# Patient Record
Sex: Male | Born: 2000 | Race: Black or African American | Hispanic: No | Marital: Single | Smoking: Never smoker
Health system: Southern US, Community
[De-identification: ages and names within clinical notes are randomized; demographics above are authoritative.]

## PROBLEM LIST (undated history)

## (undated) DIAGNOSIS — R011 Cardiac murmur, unspecified: Secondary | ICD-10-CM

## (undated) HISTORY — PX: CARDIAC CATHETERIZATION: SHX172

## (undated) HISTORY — PX: CIRCUMCISION: SHX1350

---
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
## 2019-04-19 ENCOUNTER — Encounter: Payer: Self-pay | Admitting: Emergency Medicine

## 2019-04-19 ENCOUNTER — Other Ambulatory Visit: Payer: Self-pay

## 2019-04-19 ENCOUNTER — Emergency Department
Admission: EM | Admit: 2019-04-19 | Discharge: 2019-04-19 | Disposition: A | Discharge status: Home / Self Care | Payer: Medicaid Other | Attending: Student | Admitting: Student

## 2019-04-19 DIAGNOSIS — M436 Torticollis: Secondary | ICD-10-CM | POA: Diagnosis not present

## 2019-04-19 MED ORDER — IBUPROFEN 600 MG PO TABS: 600.0000 mg | ORAL_TABLET | Freq: Three times a day (TID) | ORAL | 0 refills | Status: AC | PRN

## 2019-04-19 MED ORDER — CYCLOBENZAPRINE HCL 10 MG PO TABS: 10.0000 mg | ORAL_TABLET | Freq: Three times a day (TID) | ORAL | 0 refills | Status: AC | PRN

## 2019-04-19 NOTE — ED Triage Notes (Signed)
Woke up this morning with right neck feeling stiff.  Pain with movement. NAD. Ambulatory to check in. Felt fine when went to bed.

## 2019-04-19 NOTE — ED Notes (Signed)
See triage note  Presents with stiffness to right side of neck  States woke up this way  Denies any injury

## 2019-04-19 NOTE — ED Provider Notes (Signed)
Leonard Cooper Note   ____________________________________________   First MD Initiated Contact with Patient 04/19/19 [phone]     (approximate)  I have reviewed the triage vital signs and the nursing notes.   HISTORY  Chief Complaint Torticollis    HPI Leonard Cooper is a 18 y.o. male patient awake this morning with right lateral neck pain.  Patient states decreased range of motion with left lateral movements.  Patient denies radicular component to neck pain.  Patient rates the pain as a 10/10.  Patient described the pain as "achy/tightness".  No palliative measure for complaint.         Past Medical History:  Diagnosis Date  . Heart murmur     Patient Active Problem List   Diagnosis Date Noted  . Migraine without aura and without status migrainosus, not intractable 08/09/2016  . History of concussion 08/09/2016    Past Surgical History:  Procedure Laterality Date  . CARDIAC CATHETERIZATION     as an infant for a heart murmur  . CIRCUMCISION      Prior to Admission medications   Medication Sig Start Date End Date Taking? Authorizing Cooper  cyclobenzaprine (FLEXERIL) 10 MG tablet Take 1 tablet (10 mg total) by mouth 3 (three) times daily as needed. 04/19/19   Joni Reining, PA-C  ibuprofen (ADVIL) 600 MG tablet Take 1 tablet (600 mg total) by mouth every 8 (eight) hours as needed. 04/19/19   Joni Reining, PA-C    Allergies Penicillins  History reviewed. No pertinent family history.  Social History Social History   Tobacco Use  . Smoking status: Never Smoker  . Smokeless tobacco: Never Used  Substance Use Topics  . Alcohol use: No  . Drug use: Not on file    Review of Systems Constitutional: No fever/chills Eyes: No visual changes. ENT: No sore throat. Cardiovascular: Denies chest pain. Respiratory: Denies shortness of breath. Gastrointestinal: No abdominal pain.  No nausea, no vomiting.   No diarrhea.  No constipation. Genitourinary: Negative for dysuria. Musculoskeletal: Right lateral neck pain. Skin: Negative for rash. Neurological: Negative for headaches, focal weakness or numbness. Allergic/Immunilogical: Penicillin ____________________________________________   PHYSICAL EXAM:  VITAL SIGNS: ED Triage Vitals  Enc Vitals Group     BP 04/19/19 0758 135/71     Pulse Rate 04/19/19 0758 (!) 102     Resp 04/19/19 0758 16     Temp 04/19/19 0758 98.2 F (36.8 C)     Temp Source 04/19/19 0758 Oral     SpO2 04/19/19 0758 100 %     Weight 04/19/19 0756 170 lb (77.1 kg)     Height 04/19/19 0756 5\' 8"  (1.727 m)     Head Circumference --      Peak Flow --      Pain Score 04/19/19 0756 10     Pain Loc --      Pain Edu? --      Excl. in GC? --    Constitutional: Alert and oriented. Well appearing and in no acute distress. Neck: No stridor.  No cervical spine tenderness to palpation.  Decreased range of motion with left lateral movements Cardiovascular: Normal rate, regular rhythm. Grossly normal heart sounds.  Good peripheral circulation. Respiratory: Normal respiratory effort.  No retractions. Lungs CTAB. Neurologic:  Normal speech and language. No gross focal neurologic deficits are appreciated. No gait instability. Skin:  Skin is warm, dry and intact. No rash noted. Psychiatric: Mood and affect are  normal. Speech and behavior are normal.  ____________________________________________   LABS (all labs ordered are listed, but only abnormal results are displayed)  Labs Reviewed - No data to display ____________________________________________  EKG   ____________________________________________  RADIOLOGY  ED MD interpretation:    Official radiology report(s): No results found.  ____________________________________________   PROCEDURES  Procedure(s) performed (including Critical Care):  Procedures   ____________________________________________    INITIAL IMPRESSION / ASSESSMENT AND PLAN / ED COURSE  As part of my medical decision making, I reviewed the following data within the [place]      Patient had frequent neck pain this morning.  No radicular component to complaint.  Physical exam is consistent with torticollis.  Patient given discharge care instruction advised take medication as directed.  Patient advised follow-up PCP if condition persist.    Leonard Cooper was evaluated in Emergency Department on 04/19/2019 for the symptoms described in the history of present illness. He was evaluated in the context of the global COVID-19 pandemic, which necessitated consideration that the patient might be at risk for infection with the SARS-CoV-2 virus that causes COVID-19. Institutional protocols and algorithms that pertain to the evaluation of patients at risk for COVID-19 are in a state of rapid change based on information released by regulatory bodies including the CDC and federal and state organizations. These policies and algorithms were followed during the patient's care in the ED.       ____________________________________________   FINAL CLINICAL IMPRESSION(S) / ED DIAGNOSES  Final diagnoses:  Torticollis     ED Discharge Orders         Ordered    cyclobenzaprine (FLEXERIL) 10 MG tablet  3 times daily PRN     04/19/19 0810    ibuprofen (ADVIL) 600 MG tablet  Every 8 hours PRN     04/19/19 0810           Note:  This document was prepared using Dragon voice recognition software and may include unintentional dictation errors.    Sable Feil, PA-C 04/19/19 0815    Lilia Pro., MD 04/19/19 [phone]

## 2019-04-19 NOTE — Discharge Instructions (Addendum)
Follow discharge care instruction take medication as directed.  Be advised medication may cause drowsiness. 

## 2019-04-26 ENCOUNTER — Telehealth: Payer: Self-pay | Admitting: Surgery

## 2019-04-26 ENCOUNTER — Emergency Department (HOSPITAL_COMMUNITY)
Admission: EM | Admit: 2019-04-26 | Discharge: 2019-04-26 | Disposition: A | Discharge status: Home / Self Care | Payer: Medicaid Other | Attending: Emergency Medicine | Admitting: Emergency Medicine

## 2019-04-26 ENCOUNTER — Other Ambulatory Visit: Payer: Self-pay

## 2019-04-26 ENCOUNTER — Encounter (HOSPITAL_COMMUNITY): Payer: Self-pay | Admitting: Emergency Medicine

## 2019-04-26 DIAGNOSIS — H1031 Unspecified acute conjunctivitis, right eye: Secondary | ICD-10-CM | POA: Diagnosis not present

## 2019-04-26 DIAGNOSIS — H579 Unspecified disorder of eye and adnexa: Secondary | ICD-10-CM | POA: Diagnosis present

## 2019-04-26 MED ORDER — FLUORESCEIN SODIUM 1 MG OP STRP
1.0000 | ORAL_STRIP | Freq: Once | OPHTHALMIC | Status: AC
  Administered 2019-04-26: 1 via OPHTHALMIC
  Filled 2019-04-26: qty 1

## 2019-04-26 MED ORDER — OFLOXACIN 0.3 % OP SOLN: 1.0000 [drp] | Freq: Four times a day (QID) | OPHTHALMIC | 0 refills | Status: AC

## 2019-04-26 MED ORDER — LEVOFLOXACIN 0.5 % OP SOLN: 1.0000 [drp] | OPHTHALMIC | 0 refills | Status: AC

## 2019-04-26 MED ORDER — TETRACAINE HCL 0.5 % OP SOLN
2.0000 [drp] | Freq: Once | OPHTHALMIC | Status: AC
  Administered 2019-04-26: 12:00:00 2 [drp] via OPHTHALMIC
  Filled 2019-04-26: qty 4

## 2019-04-26 NOTE — ED Provider Notes (Addendum)
[facility] EMERGENCY DEPARTMENT Provider Note   CSN: [id number] Arrival date & time: 04/26/19  1129     History Chief Complaint  Patient presents with  . eye redness    Leonard Cooper is a 18 y.o. male with no significant PMH presents to the ED with complaints of right eye and "burning". Patient reports that he slept in his contact lenses last night and woke up with a red, irritated right eye. He is unsure whether or not his contact lens is still in his right eye, but he took it out of his left eye. He endorses clear, profuse drainage from right eye. He reports that he has had conjunctivitis before and that this feels similar. He denies any blurred vision, except mild difficulty due to profuse drainage. Denies any fevers, chills, recent illness, sick contacts, sore throat, rhinorrhea, headache, or other symptoms at this time. He denies any significant eye pain, but rather "burning sensation". No foreign body sensation. He endorses photophobia in the affected eye.  Patient denies trauma.  HPI     Past Medical History:  Diagnosis Date  . Heart murmur     Patient Active Problem List   Diagnosis Date Noted  . Migraine without aura and without status migrainosus, not intractable 08/09/2016  . History of concussion 08/09/2016    Past Surgical History:  Procedure Laterality Date  . CARDIAC CATHETERIZATION     as an infant for a heart murmur  . CIRCUMCISION         No family history on file.  Social History   Tobacco Use  . Smoking status: Never Smoker  . Smokeless tobacco: Never Used  Substance Use Topics  . Alcohol use: No  . Drug use: Not on file    Home Medications Prior to Admission medications   Medication Sig Start Date End Date Taking? Authorizing Provider  cyclobenzaprine (FLEXERIL) 10 MG tablet Take 1 tablet (10 mg total) by mouth 3 (three) times daily as needed. 04/19/19   Joni Reining, PA-C  ibuprofen (ADVIL) 600 MG tablet Take 1  tablet (600 mg total) by mouth every 8 (eight) hours as needed. 04/19/19   Joni Reining, PA-C  levofloxacin Charlean Sanfilippo) 0.5 % ophthalmic solution Place 1 drop into the right eye every 2 (two) hours for 7 days. 1 drops into affected eye(s) every 2 hours for 2 days with maximum 8 drops/day, THEN one drop into affected eye(s) every 6 hours for 5 days. 04/26/19 05/03/19  Lorelee New, PA-C    Allergies    Penicillins  Review of Systems   Review of Systems  Constitutional: Negative for chills and fever.  HENT: Negative for rhinorrhea, sneezing and sore throat.   Eyes: Positive for photophobia, discharge and redness. Negative for itching.    Physical Exam Updated Vital Signs BP 128/82 (BP Location: Right Arm)   Pulse (!) 56   Temp 98.7 F (37.1 C) (Oral)   Resp 20   SpO2 99%   Physical Exam Vitals and nursing note reviewed. Exam conducted with a chaperone present.  Constitutional:      Appearance: Normal appearance.  HENT:     Head: Normocephalic and atraumatic.  Eyes:     Comments: PERRL, EOM intact. Right eye: Clear, copious discharge.  Injected conjunctiva.  No pupillary defect.  Fluorescein with bluelight testing reveals no uptake concerning for abrasion or ulceration.  Lids were everted and no evidence of foreign body or contact lens.  Visual acuity same  in each eye, consistent with his baseline without contact lenses.  Cardiovascular:     Rate and Rhythm: Normal rate and regular rhythm.     Pulses: Normal pulses.     Heart sounds: Normal heart sounds.  Pulmonary:     Effort: Pulmonary effort is normal. No respiratory distress.     Breath sounds: Normal breath sounds.  Musculoskeletal:     Cervical back: Normal range of motion and neck supple.  Skin:    General: Skin is dry.  Neurological:     Mental Status: He is alert.     GCS: GCS eye subscore is 4. GCS verbal subscore is 5. GCS motor subscore is 6.  Psychiatric:        Mood and Affect: Mood normal.         Behavior: Behavior normal.        Thought Content: Thought content normal.     ED Results / Procedures / Treatments   Labs (all labs ordered are listed, but only abnormal results are displayed) Labs Reviewed - No data to display  EKG None  Radiology No results found.  Procedures Procedures (including critical care time)  Medications Ordered in ED Medications  fluorescein ophthalmic strip 1 strip (1 strip Right Eye Given by Other 04/26/19 1158)  tetracaine (PONTOCAINE) 0.5 % ophthalmic solution 2 drop (2 drops Right Eye Given by Other 04/26/19 1159)    ED Course  I have reviewed the triage vital signs and the nursing notes.  Pertinent labs & imaging results that were available during my care of the patient were reviewed by me and considered in my medical decision making (see chart for details).    MDM Rules/Calculators/A&P        Patient presents to the ED with a 4-hour history of conjunctivitis.  Patient was concerned that his contact lens was still in his eye, but after exploration of the version, no evidence of contact lens.  Suspect that it may have fallen out last night after he fell asleep on his couch.  Fluorescein testing using bluelight demonstrated no uptake concerning for abrasion or ulceration.  Otherwise would consider tetanus Boostrix injection.  Regardless, given contact lens use and physical exam concerning for conjunctivitis, will treat with levofloxacin ophthalmic drops x7 days to cover for Pseudomonas.  While clear drainage suggest viral etiology, no other systemic symptoms.  Would likely see bilateral conjunctivitis in case of allergic conjunctivitis.  Encouraged patient to follow-up with his PCP regarding today's visit.  Educated patient about proper hand hygiene in an effort to avoid transferring infection to left eye.  He reports having had conjunctivitis multiple times in the past 6 months and feels that this is the same.  Based on his history and physical  exam, I have low suspicion for globe rupture or glaucoma.  No corneal opacity concerning for keratitis.  Strict return precautions including but not limited to worsening pain, changes in visual acuity, pressure behind the eye, pain with EOMs, or any other new or worsening symptoms.  Encouraged him to follow-up with his ophthalmologist outpatient regarding his recurrent conjunctivitis after having been told that he can safely sleep and his contact lenses.  Avoid him to continue doing so until he can at least follow-up with his ophthalmologist or optometrist.  All of the evaluation and work-up results were discussed with the patient and any family at bedside. They were provided opportunity to ask any additional questions and have none at this time. They have expressed understanding  of verbal discharge instructions as well as return precautions and are agreeable to the plan.   1:14 PM Received message from case management and patient is requesting medication that is covered by his insurance.  Prescribed ofloxacin ophthalmic drops instead.   Final Clinical Impression(s) / ED Diagnoses Final diagnoses:  Acute conjunctivitis of right eye, unspecified acute conjunctivitis type    Rx / DC Orders ED Discharge Orders         Ordered    levofloxacin Leonard Cooper) 0.5 % ophthalmic solution  Every 2 hours     04/26/19 1212           Corena Herter, PA-C 04/26/19 1228    Corena Herter, PA-C 04/26/19 1315    Lajean Saver, MD 04/27/19 [phone]

## 2019-04-26 NOTE — Discharge Instructions (Addendum)
Please use the ophthalmic medication, as prescribed.  1 drop into affected eye every 2 hours for 2 days, then 1 drop into affected eye every 6 hours for 5 days.  Please follow-up with your outpatient ophthalmologist/optometrist.  Please return to the ED or seek medical attention for any change in visual acuity, worsening eye pain, eye pain with looking around, pressure sensation behind the eyes, fevers or chills, or any other new or worsening symptoms.

## 2019-04-26 NOTE — ED Triage Notes (Signed)
C/o R eye redness and burning since this morning.

## 2019-04-26 NOTE — ED Notes (Signed)
Discharge instructions and prescription discussed with Pt. Pt verbalized understanding. Pt stable and ambulatory.    

## 2019-04-26 NOTE — Telephone Encounter (Signed)
ED CM received call from [place] concerning on antibiotic prescription clarification, CM sent secured message to the the prescriber to update. No further ED CM needs

## 2019-06-20 ENCOUNTER — Ambulatory Visit: Payer: Medicaid Other | Attending: Internal Medicine

## 2019-06-20 DIAGNOSIS — Z20822 Contact with and (suspected) exposure to covid-19: Secondary | ICD-10-CM

## 2019-06-21 LAB — NOVEL CORONAVIRUS, NAA: SARS-CoV-2, NAA: NOT DETECTED
